# Patient Record
Sex: Female | Born: 1965 | Race: White | Hispanic: No | Marital: Married | Smoking: Never smoker
Health system: Southern US, Community
[De-identification: ages and names within clinical notes are randomized; demographics above are authoritative.]

---

## 2009-05-30 ENCOUNTER — Ambulatory Visit: Payer: Self-pay | Admitting: Nurse Practitioner

## 2010-08-20 ENCOUNTER — Ambulatory Visit: Payer: Self-pay | Admitting: Family Medicine

## 2010-11-23 ENCOUNTER — Ambulatory Visit: Payer: Self-pay | Admitting: Family Medicine

## 2011-02-23 ENCOUNTER — Ambulatory Visit: Payer: Self-pay

## 2011-09-11 ENCOUNTER — Ambulatory Visit: Payer: Self-pay | Admitting: Family Medicine

## 2013-07-26 IMAGING — CR DG ABDOMEN 1V
1 series · 1 of 1 positions shown · non-contrast
Comparison: none

REASON FOR EXAM: rt side, mid low back pain
COMMENTS:

PROCEDURE:     DXR - DXR KIDNEY URETER BLADDER  - November 23, 2010  [DATE]
RESULT:     Comparison: None.

[view not recorded]
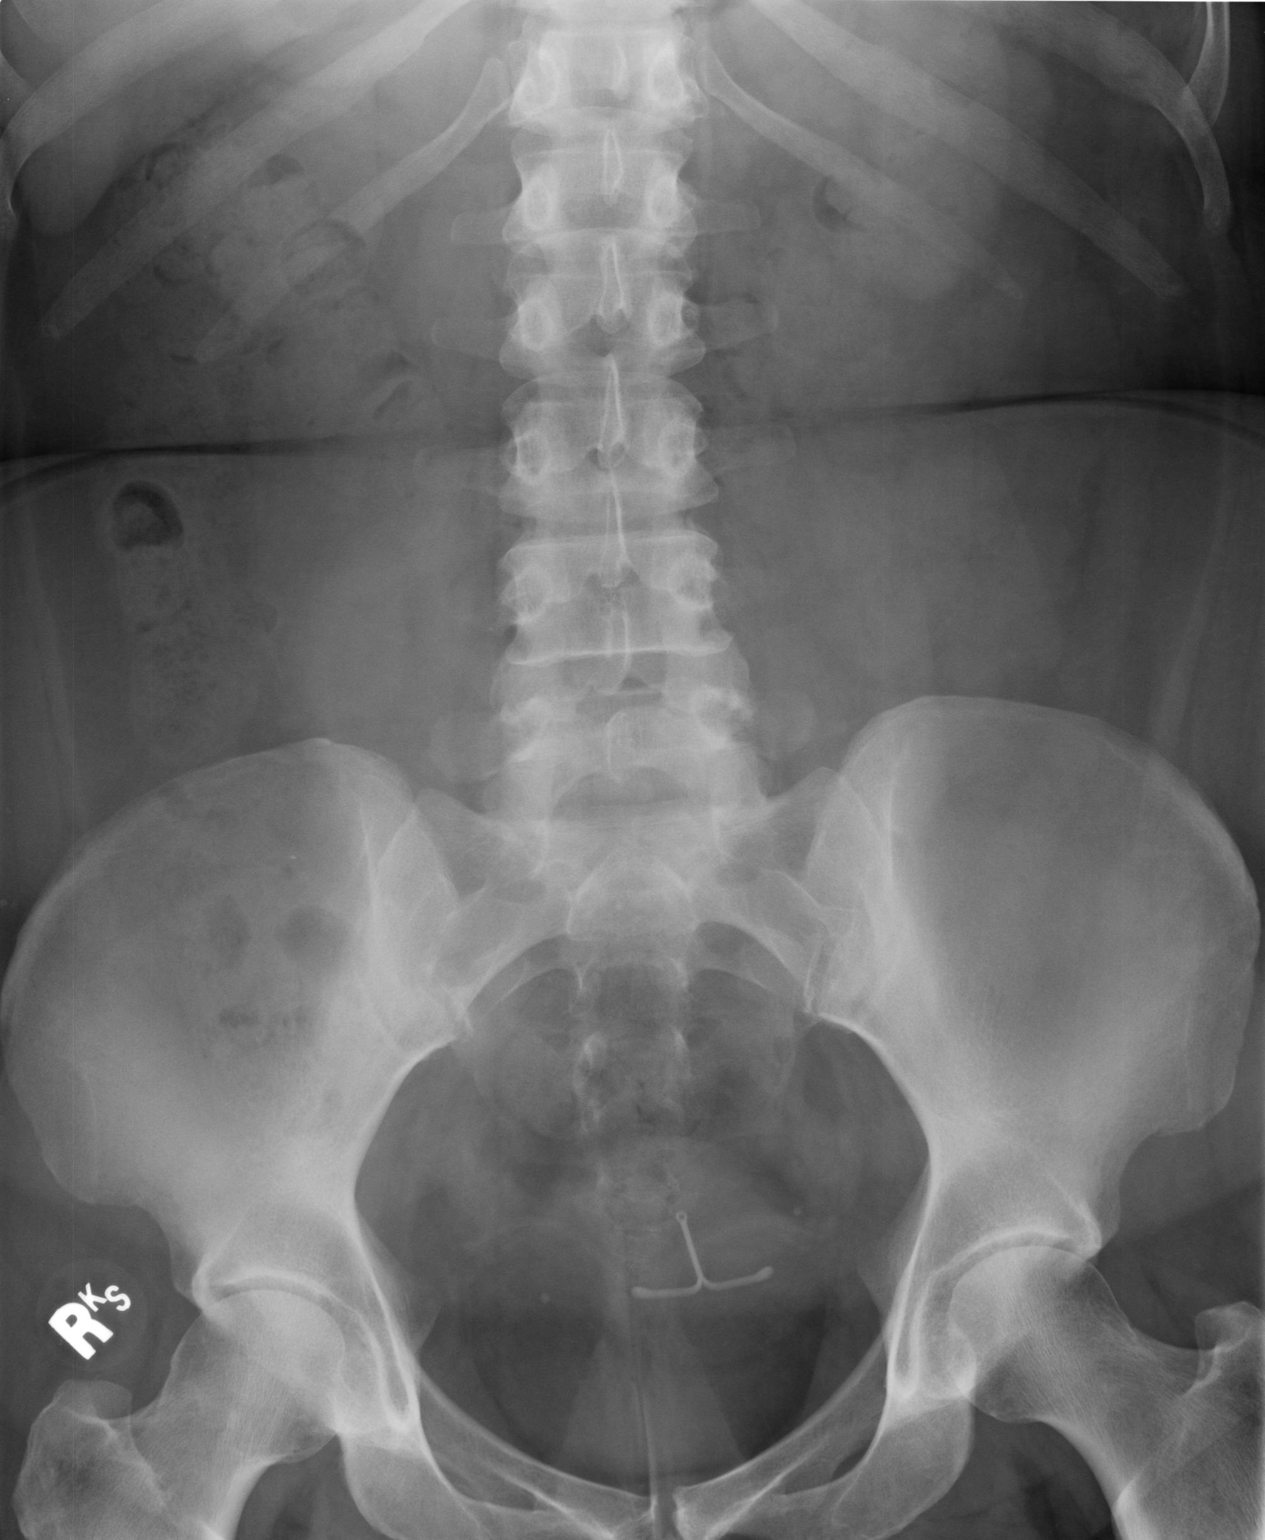

[1 of 1 positions shown; findings below may reference images not displayed]

FINDINGS: No definite renal calculi identified. Evaluation is slightly limited by
overlying stool and bowel gas. Calcifications in the pelvis are presumed to
represent phleboliths. An IUD device projects over the pelvis. Stool is seen
in the colon. There is a relative paucity of small bowel gas. However, no
evidence of dilated loops of small bowel.
IMPRESSION: 1. No definite nephrolithiasis.
2. Nonobstructed bowel gas pattern.

## 2013-10-26 IMAGING — US ABDOMEN ULTRASOUND LIMITED
1 series · 17 of 25 positions shown · non-contrast
Comparison: none

REASON FOR EXAM: RUQ abd pain abnormal liver functions
COMMENTS:

[Series 1: abdomen ultrasound limited · 17 of 90 slices shown]
[im 1/90]
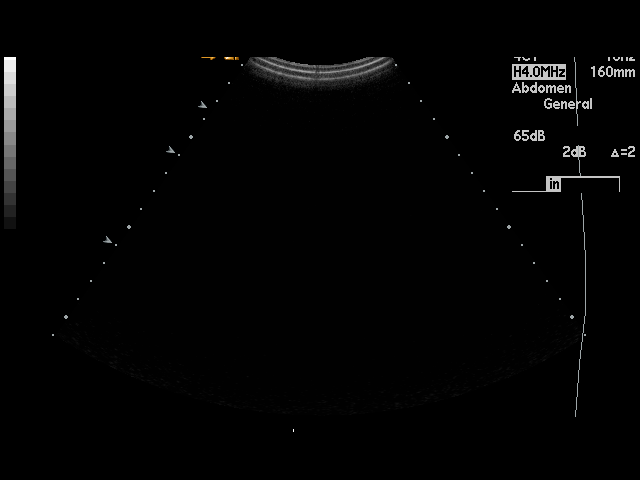
[im 8/90]
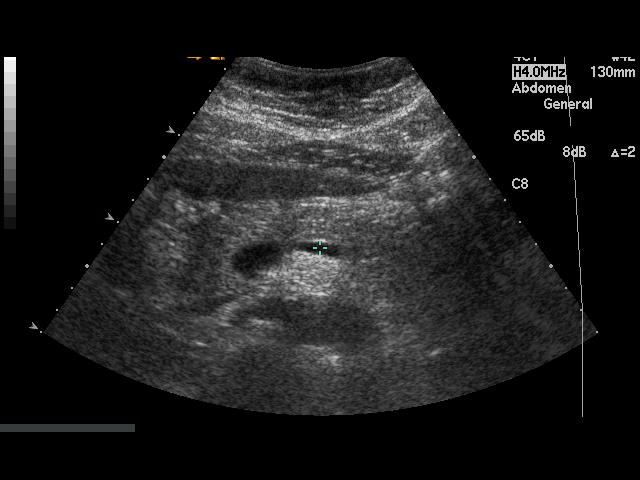
[im 12/90]
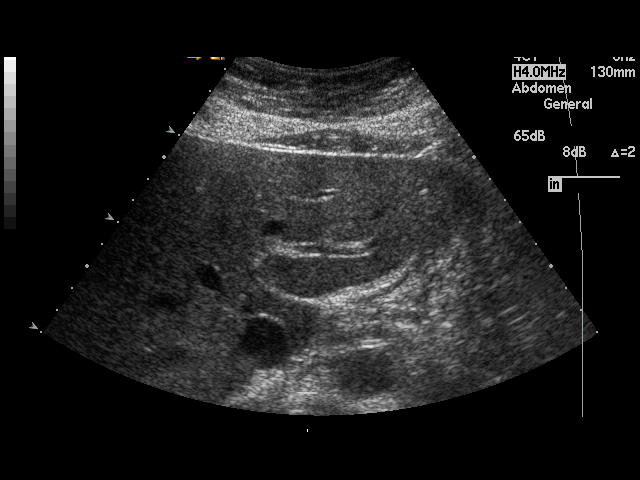
[im 19/90]
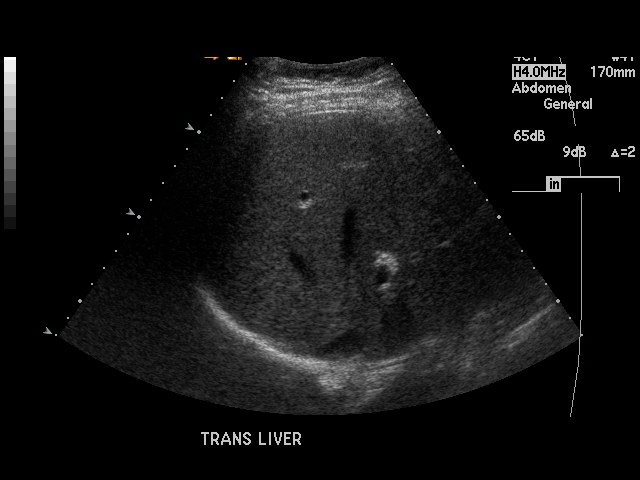
[im 23/90]
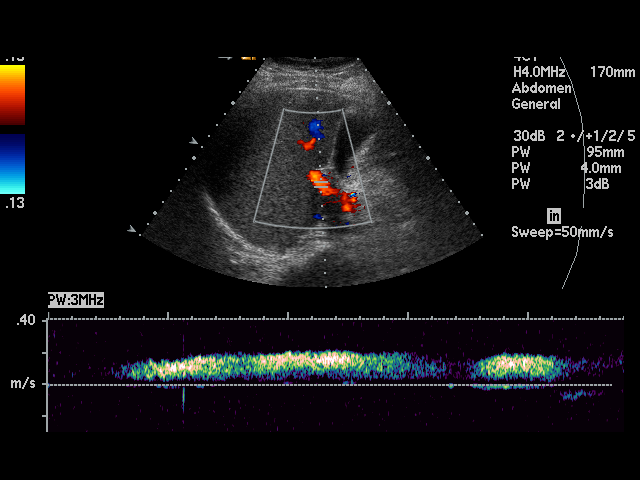
[im 30/90]
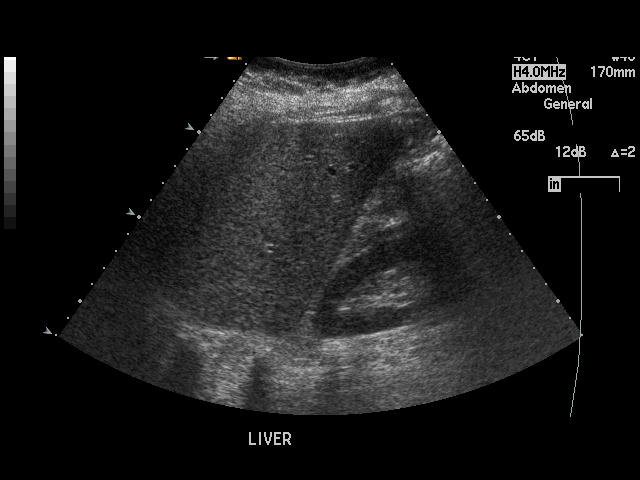
[im 34/90]
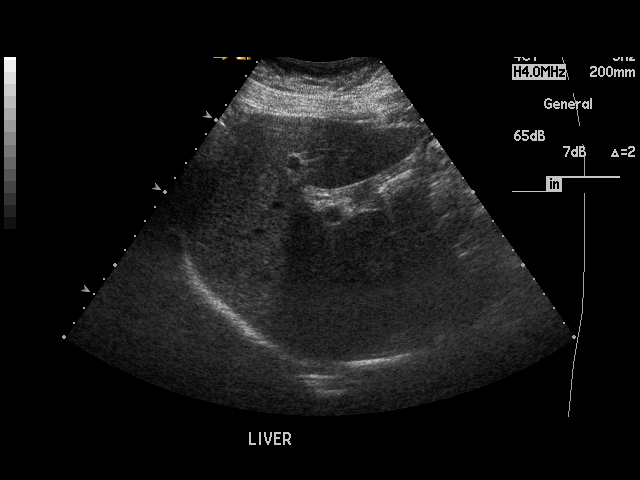
[im 41/90]
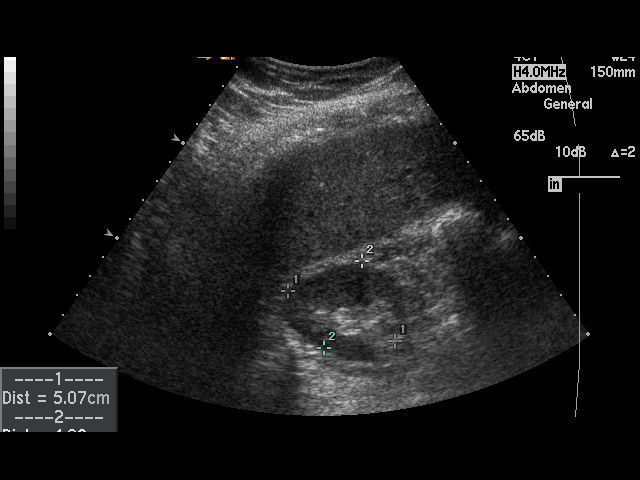
[im 45/90]
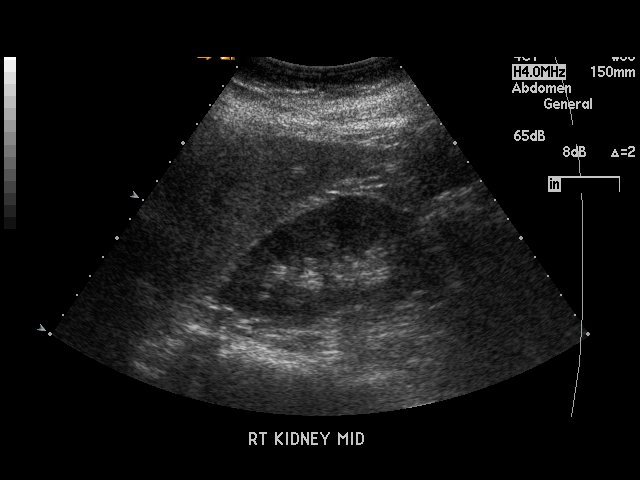
[im 49/90]
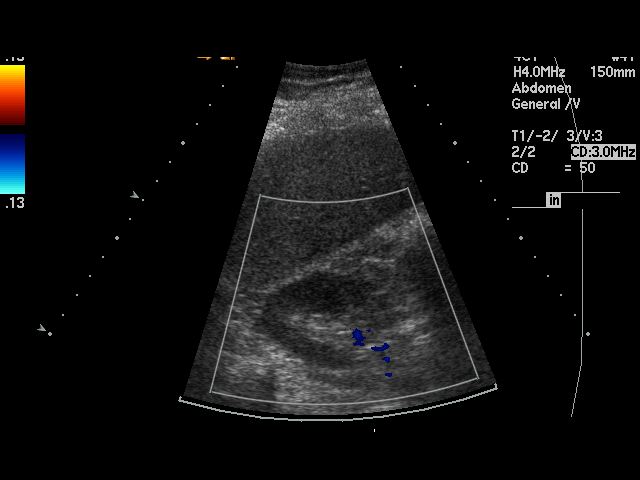
[im 56/90]
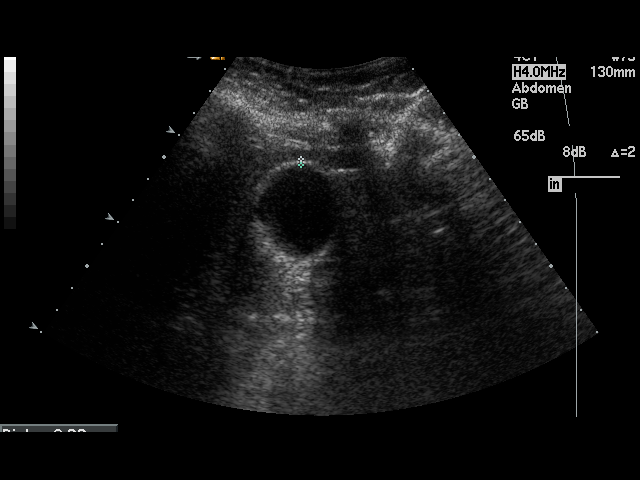
[im 60/90]
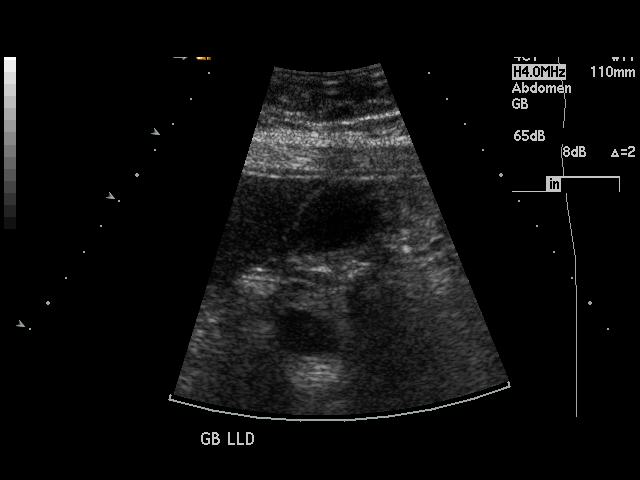
[im 67/90]
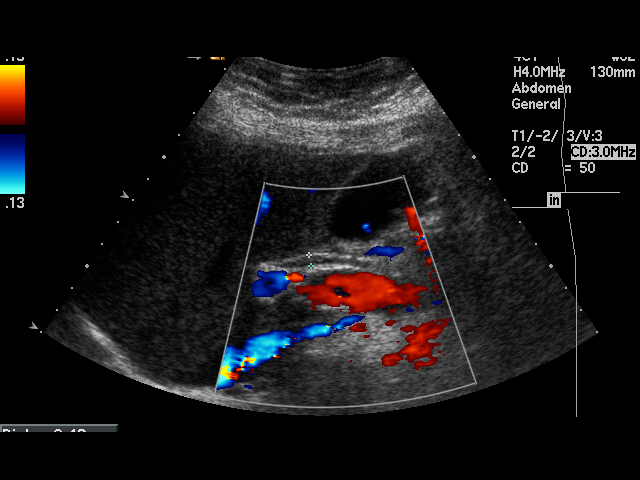
[im 71/90]
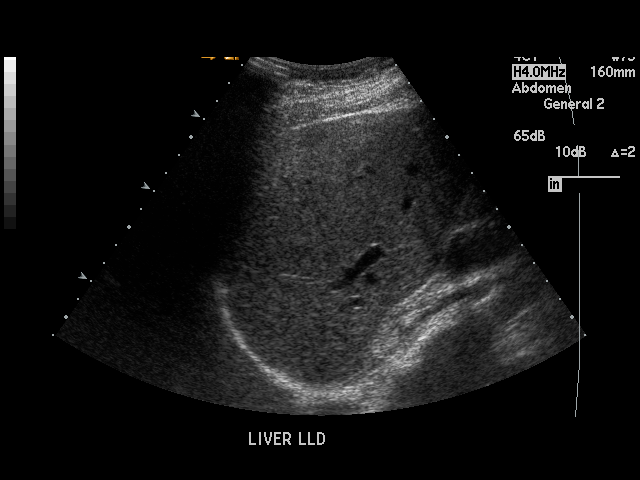
[im 78/90]
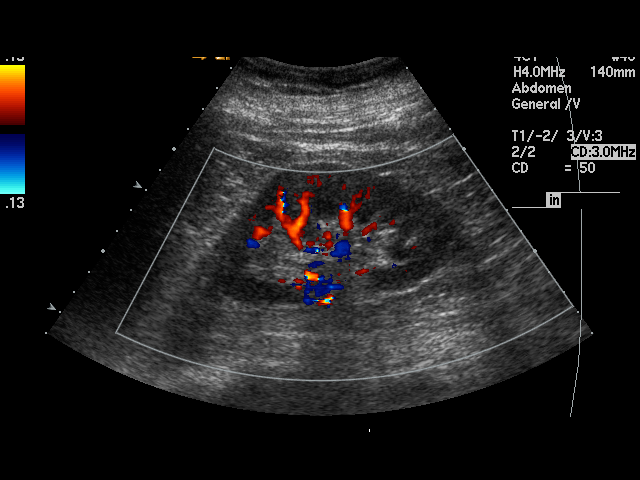
[im 82/90]
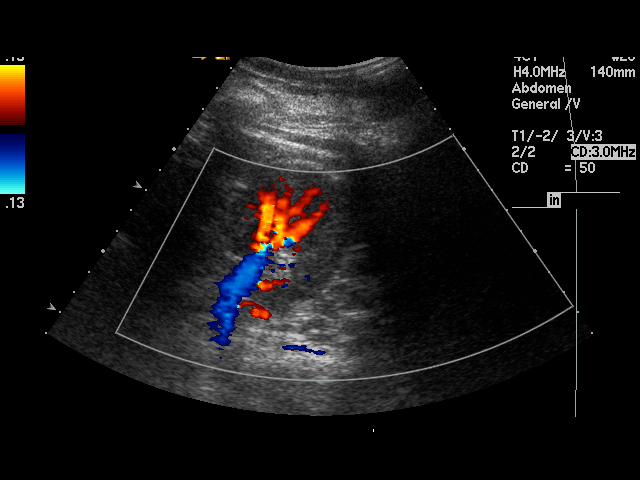
[im 90/90]
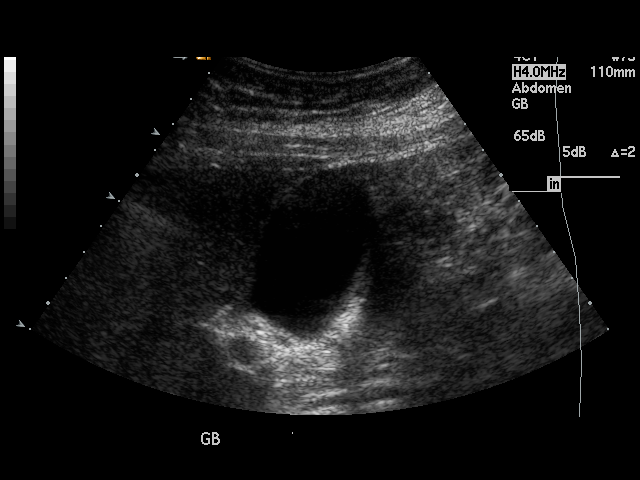

[17 of 25 positions shown; findings below may reference images not displayed]

PROCEDURE:     XISFEXISHVILI - XISFEXISHVILI ABDOMEN UPPER GENERAL  - February 23, 2011  [DATE]

RESULT:     Findings: The liver demonstrates homogeneous echotexture.
Hepatic pedal flow is identified within the portal vein. The aorta and IVC
are unremarkable. Partial visualization of the pancreas in unremarkable.
Evaluation gallbladder fossa demonstrates no evidence of pericholecystic
fluid gallstones or sludging. Gallbladder wall thickness is 2.2 mm. Common
bile duct  measures 4.2 mm in diameter. The spleen demonstrates a
homogeneous echotecture and measures 9.38 cm in longitudinal dimensions.
Evaluation of the kidneys demonstrates no evidence of hydronephrosis, masses
or calculi. The right kidney measures 10.17 x 5.54 x 4.75 cm the left 11.1 x
4.81 x 4.7 cm.
IMPRESSION: Unremarkable abdominal ultrasound as described above.

## 2018-02-27 ENCOUNTER — Ambulatory Visit
Admission: EM | Admit: 2018-02-27 | Discharge: 2018-02-27 | Disposition: A | Discharge status: Home / Self Care | Payer: BC Managed Care – PPO | Attending: Family Medicine | Admitting: Family Medicine

## 2018-02-27 ENCOUNTER — Other Ambulatory Visit: Payer: Self-pay

## 2018-02-27 DIAGNOSIS — J0101 Acute recurrent maxillary sinusitis: Secondary | ICD-10-CM | POA: Diagnosis not present

## 2018-02-27 MED ORDER — FLUTICASONE PROPIONATE 50 MCG/ACT NA SUSP: 2.0000 | Freq: Every day | NASAL | 0 refills | Status: AC

## 2018-02-27 MED ORDER — AMOXICILLIN-POT CLAVULANATE 875-125 MG PO TABS: 1.0000 | ORAL_TABLET | Freq: Two times a day (BID) | ORAL | 0 refills | Status: AC

## 2018-02-27 NOTE — ED Triage Notes (Signed)
Pain and pressure in face and head since Tuesday. "I think I have a sinus infection." Pain 8/10

## 2018-02-27 NOTE — ED Provider Notes (Signed)
[facility]    CSN: [id number] Arrival date & time: 02/27/18  1158     History   Chief Complaint Chief Complaint  Patient presents with  . Facial Pain  . Sinus Problem    HPI Katrina Gilmore is a 53 y.o. female.   HPI  53 yo female presents with pain and pressure in her face mostly on the right side that she started having approximately 6 days ago.  Her pain is rated as an 8 out of 10.  In review of her previous medical records she has recurrent maxillary sinus infections.  She has had nasal discharge.  She is tried over-the-counter medications which have not been successful.  She is afebrile at the present time.  Pulse rate is 112 blood pressure 140/94 respirations 17 O2 sats 98%         History reviewed. No pertinent past medical history.  There are no active problems to display for this patient.   History reviewed. No pertinent surgical history.  OB History   No obstetric history on file.      Home Medications    Prior to Admission medications   Medication Sig Start Date End Date Taking? Authorizing Provider  levonorgestrel (MIRENA) 20 MCG/24HR IUD 1 each by Intrauterine route once.   Yes [provider]  Vitamin D, Ergocalciferol, (DRISDOL) 1.25 MG (50000 UT) CAPS capsule Take by mouth. 04/11/17 04/11/18 Yes [provider]  amoxicillin-clavulanate (AUGMENTIN) 875-125 MG tablet Take 1 tablet by mouth every 12 (twelve) hours. 02/27/18   Lutricia Feil, PA-C  fluticasone (FLONASE) 50 MCG/ACT nasal spray Place 2 sprays into both nostrils daily. 02/27/18   Lutricia Feil, PA-C    Family History History reviewed. No pertinent family history.  Social History Social History   Tobacco Use  . Smoking status: Never Smoker  . Smokeless tobacco: Never Used  Substance Use Topics  . Alcohol use: Not Currently  . Drug use: Never     Allergies   Codeine   Review of Systems Review of Systems  Constitutional: Positive for  activity change and chills. Negative for fatigue and fever.  HENT: Positive for congestion, facial swelling, postnasal drip, sinus pressure and sneezing.   Respiratory: Negative for cough.   All other systems reviewed and are negative.    Physical Exam Triage Vital Signs ED Triage Vitals  Enc Vitals Group     BP 02/27/18 1212 (!) 140/94     Pulse Rate 02/27/18 1212 (!) 112     Resp 02/27/18 1212 17     Temp 02/27/18 1212 98.2 F (36.8 C)     Temp Source 02/27/18 1212 Oral     SpO2 02/27/18 1212 98 %     Weight 02/27/18 1213 220 lb (99.8 kg)     Height 02/27/18 1213 5\' 4"  (1.626 m)     Head Circumference --      Peak Flow --      Pain Score 02/27/18 1213 8     Pain Loc --      Pain Edu? --      Excl. in GC? --    No data found.  Updated Vital Signs BP (!) 140/94 (BP Location: Left Arm)   Pulse (!) 112   Temp 98.2 F (36.8 C) (Oral)   Resp 17   Ht 5\' 4"  (1.626 m)   Wt 220 lb (99.8 kg)   SpO2 98%   BMI 37.76 kg/m   Visual Acuity Right  Eye Distance:   Left Eye Distance:   Bilateral Distance:    Right Eye Near:   Left Eye Near:    Bilateral Near:     Physical Exam Vitals signs and nursing note reviewed.  Constitutional:      General: She is not in acute distress.    Appearance: Normal appearance. She is obese. She is not ill-appearing, toxic-appearing or diaphoretic.  HENT:     Head: Normocephalic and atraumatic.     Comments: Patient has tenderness to percussion over the right maxillary sinus.  She does not have percussion or tenderness over the frontal sinuses    Right Ear: Tympanic membrane, ear canal and external ear normal.     Left Ear: Tympanic membrane, ear canal and external ear normal.     Nose: Congestion present.     Mouth/Throat:     Mouth: Mucous membranes are moist.     Pharynx: Oropharynx is clear. No oropharyngeal exudate or posterior oropharyngeal erythema.  Eyes:     General:        Right eye: No discharge.        Left eye: No  discharge.     Conjunctiva/sclera: Conjunctivae normal.  Neck:     Musculoskeletal: Normal range of motion and neck supple.  Pulmonary:     Effort: Pulmonary effort is normal.     Breath sounds: Normal breath sounds.  Musculoskeletal: Normal range of motion.  Lymphadenopathy:     Cervical: No cervical adenopathy.  Skin:    General: Skin is warm and dry.  Neurological:     General: No focal deficit present.     Mental Status: She is alert and oriented to person, place, and time.  Psychiatric:        Mood and Affect: Mood normal.        Behavior: Behavior normal.        Thought Content: Thought content normal.        Judgment: Judgment normal.      UC Treatments / Results  Labs (all labs ordered are listed, but only abnormal results are displayed) Labs Reviewed - No data to display  EKG None  Radiology No results found.  Procedures Procedures (including critical care time)  Medications Ordered in UC Medications - No data to display  Initial Impression / Assessment and Plan / UC Course  I have reviewed the triage vital signs and the nursing notes.  Pertinent labs & imaging results that were available during my care of the patient were reviewed by me and considered in my medical decision making (see chart for details).   She has recurrent maxillary sinusitis.  Urged her to use Flonase.  Also given prescription for Augmentin.  She has an appointment with her primary care physician tomorrow and she will discuss this with him at that time.   Final Clinical Impressions(s) / UC Diagnoses   Final diagnoses:  Acute recurrent maxillary sinusitis   Discharge Instructions   None    ED Prescriptions    Medication Sig Dispense Auth. Provider   amoxicillin-clavulanate (AUGMENTIN) 875-125 MG tablet Take 1 tablet by mouth every 12 (twelve) hours. 20 tablet Ovid Curdoemer, Jazleen Robeck P, PA-C   fluticasone (FLONASE) 50 MCG/ACT nasal spray Place 2 sprays into both nostrils daily. 16 g  Lutricia Feiloemer, Dailyn Kempner P, PA-C     Controlled Substance Prescriptions [place] Controlled Substance Registry consulted? Not Applicable   Lutricia FeilRoemer, Mica Ramdass P, PA-C 02/27/18 1407
